# Patient Record
Sex: Male | Born: 1985 | Race: Black or African American | Hispanic: No | Marital: Single | Smoking: Never smoker
Health system: Southern US, Community
[De-identification: ages and names within clinical notes are randomized; demographics above are authoritative.]

---

## 2010-08-24 ENCOUNTER — Emergency Department (HOSPITAL_COMMUNITY)
Admission: EM | Admit: 2010-08-24 | Discharge: 2010-08-25 | Disposition: A | Discharge status: Home / Self Care | Payer: Self-pay | Attending: Emergency Medicine | Admitting: Emergency Medicine

## 2010-08-24 DIAGNOSIS — R109 Unspecified abdominal pain: Secondary | ICD-10-CM | POA: Insufficient documentation

## 2010-08-24 DIAGNOSIS — G8929 Other chronic pain: Secondary | ICD-10-CM | POA: Insufficient documentation

## 2010-08-24 LAB — URINALYSIS, ROUTINE W REFLEX MICROSCOPIC
Nitrite: NEGATIVE
Specific Gravity, Urine: 1.028 (ref 1.005–1.030)
Urobilinogen, UA: 1 mg/dL (ref 0.0–1.0)
pH: 6 (ref 5.0–8.0)

## 2010-08-25 LAB — COMPREHENSIVE METABOLIC PANEL
ALT: 15 U/L (ref 0–53)
AST: 18 U/L (ref 0–37)
Alkaline Phosphatase: 61 U/L (ref 39–117)
CO2: 25 mEq/L (ref 19–32)
Calcium: 9.2 mg/dL (ref 8.4–10.5)
GFR calc Af Amer: >60 mL/min (ref >60)
GFR calc non Af Amer: >60 mL/min (ref >60)
Potassium: 4 mEq/L (ref 3.5–5.1)
Sodium: 138 mEq/L (ref 135–145)
Total Protein: 7.6 g/dL (ref 6.0–8.3)

## 2010-08-25 LAB — CBC
Hemoglobin: 12.6 g/dL — ABNORMAL LOW (ref 13.0–17.0)
MCHC: 33.2 g/dL (ref 30.0–36.0)
WBC: 8.5 10*3/uL (ref 4.0–10.5)

## 2010-08-25 LAB — DIFFERENTIAL
Basophils Absolute: 0 10*3/uL (ref 0.0–0.1)
Basophils Relative: 0 % (ref 0–1)
Lymphocytes Relative: 46 % (ref 12–46)
Monocytes Absolute: 0.6 10*3/uL (ref 0.1–1.0)
Neutro Abs: 3.8 10*3/uL (ref 1.7–7.7)
Neutrophils Relative %: 44 % (ref 43–77)

## 2017-01-05 ENCOUNTER — Encounter (HOSPITAL_BASED_OUTPATIENT_CLINIC_OR_DEPARTMENT_OTHER): Payer: Self-pay

## 2017-01-05 ENCOUNTER — Emergency Department (HOSPITAL_BASED_OUTPATIENT_CLINIC_OR_DEPARTMENT_OTHER)
Admission: EM | Admit: 2017-01-05 | Discharge: 2017-01-06 | Disposition: A | Discharge status: Home / Self Care | Payer: No Typology Code available for payment source | Attending: Emergency Medicine | Admitting: Emergency Medicine

## 2017-01-05 DIAGNOSIS — M25511 Pain in right shoulder: Secondary | ICD-10-CM | POA: Insufficient documentation

## 2017-01-05 DIAGNOSIS — M549 Dorsalgia, unspecified: Secondary | ICD-10-CM | POA: Insufficient documentation
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
## 2017-01-06 ENCOUNTER — Encounter (HOSPITAL_BASED_OUTPATIENT_CLINIC_OR_DEPARTMENT_OTHER): Payer: Self-pay | Admitting: Emergency Medicine

## 2017-01-06 MED ORDER — METHOCARBAMOL 500 MG PO TABS
1000.0000 mg | ORAL_TABLET | Freq: Once | ORAL | Status: AC
Start: 2017-01-06 — End: 2017-01-06
  Administered 2017-01-06: 1000 mg via ORAL
  Filled 2017-01-06: qty 2

## 2017-01-06 MED ORDER — METHOCARBAMOL 500 MG PO TABS: 500.0000 mg | ORAL_TABLET | Freq: Two times a day (BID) | ORAL | 0 refills | Status: AC

## 2017-01-06 MED ORDER — NAPROXEN 375 MG PO TABS: 375.0000 mg | ORAL_TABLET | Freq: Two times a day (BID) | ORAL | 0 refills | Status: AC

## 2017-01-06 MED ORDER — NAPROXEN 250 MG PO TABS
500.0000 mg | ORAL_TABLET | Freq: Once | ORAL | Status: AC
  Administered 2017-01-06: 500 mg via ORAL
  Filled 2017-01-06: qty 2
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
## 2017-03-25 ENCOUNTER — Other Ambulatory Visit (HOSPITAL_COMMUNITY): Payer: Self-pay | Admitting: Research Study

## 2017-03-25 DIAGNOSIS — Z006 Encounter for examination for normal comparison and control in clinical research program: Secondary | ICD-10-CM

## 2017-03-28 ENCOUNTER — Ambulatory Visit (HOSPITAL_COMMUNITY)
Admission: RE | Admit: 2017-03-28 | Discharge: 2017-03-28 | Disposition: A | Discharge status: Home / Self Care | Payer: Self-pay | Source: Ambulatory Visit | Attending: Research Study | Admitting: Research Study

## 2017-03-28 DIAGNOSIS — Z006 Encounter for examination for normal comparison and control in clinical research program: Secondary | ICD-10-CM | POA: Insufficient documentation

## 2019-03-17 IMAGING — CT CT ABD-PELV W/O CM
3 series · 17 of 46 positions shown, 20 images · non-contrast
Comparison: none

[Series 3: postvldax · axial · 0.74mm/px · z∈[+694,+1114]mm · 13 of 95 slices shown, 16 images]
[im 7/95  soft-tissue]
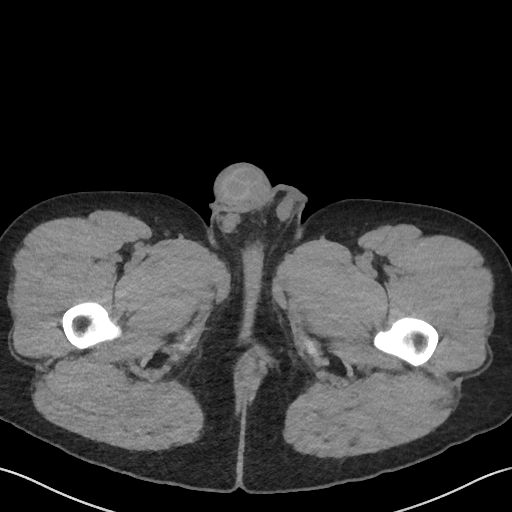
[im 7/95  bone]
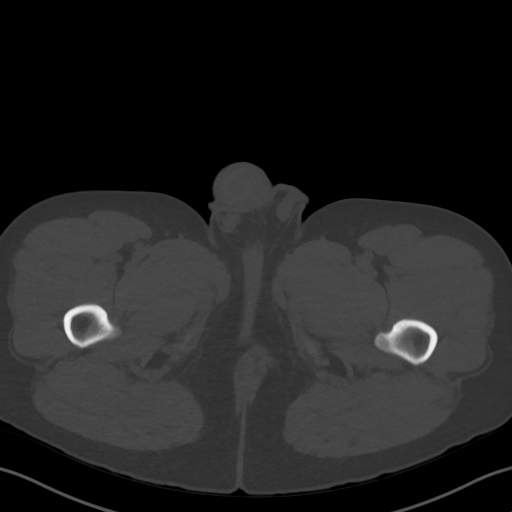
[im 16/95  soft-tissue]
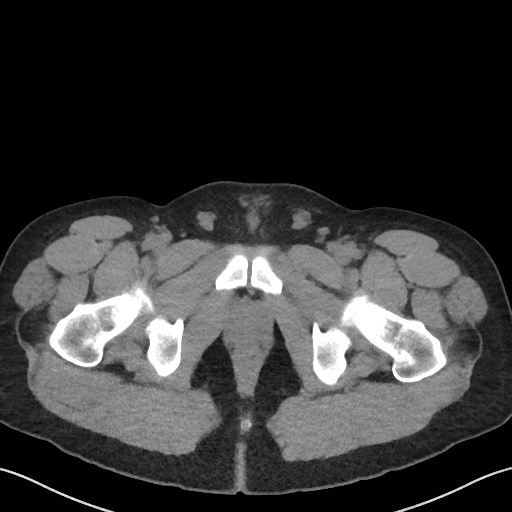
[im 25/95  soft-tissue]
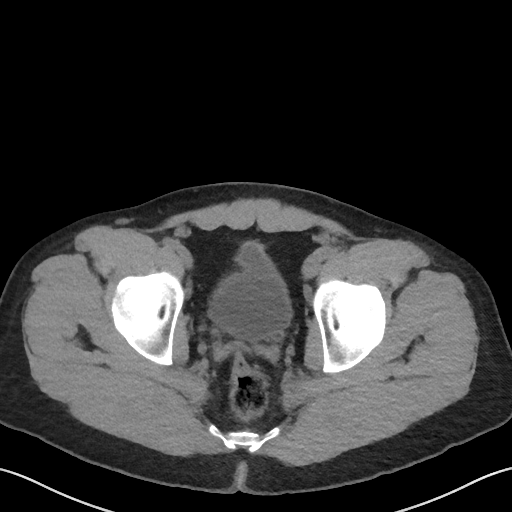
[im 34/95  soft-tissue]
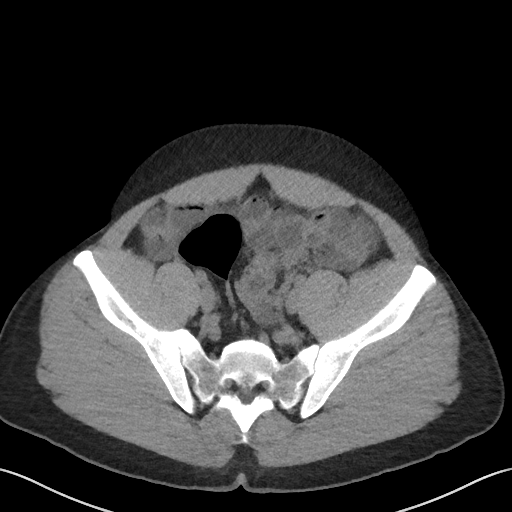
[im 43/95  soft-tissue]
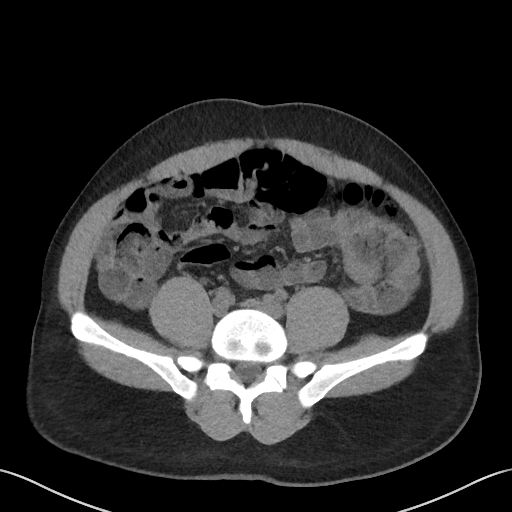
[im 52/95  soft-tissue]
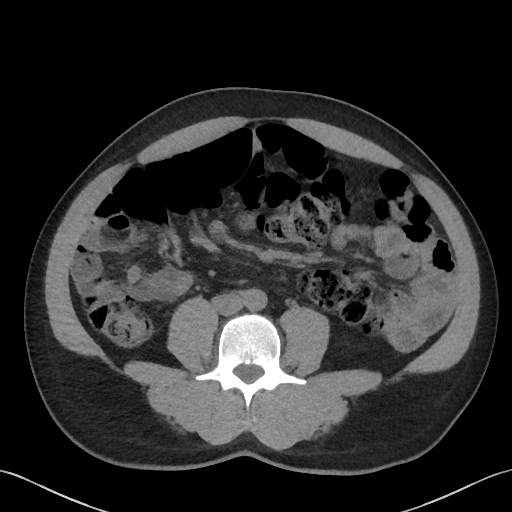
[im 61/95  soft-tissue]
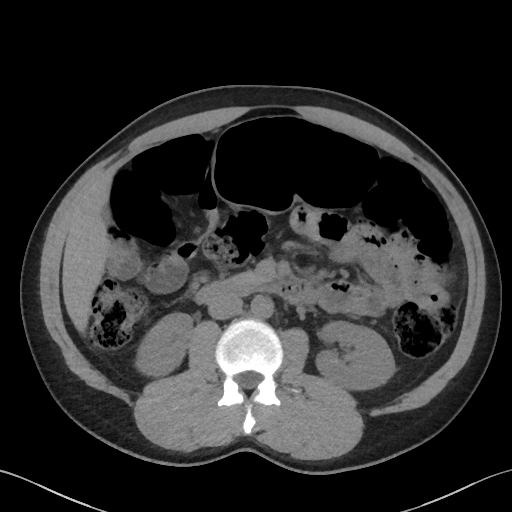
[im 70/95  soft-tissue]
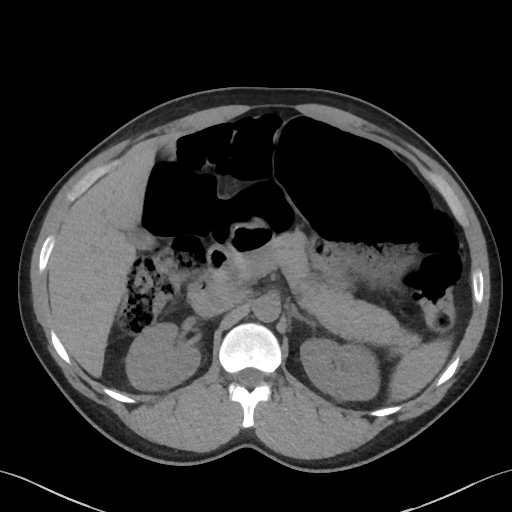
[im 79/95  soft-tissue]
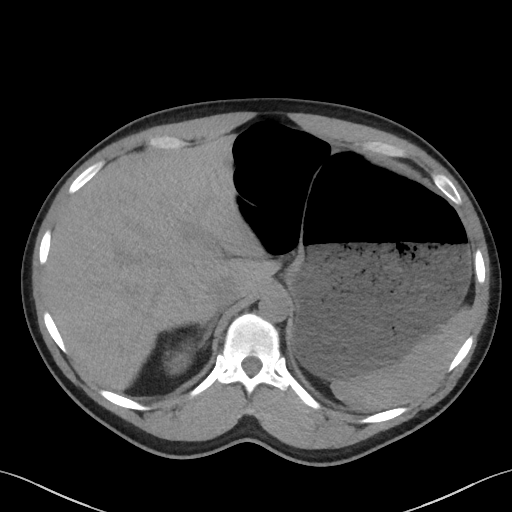
[im 79/95  bone]
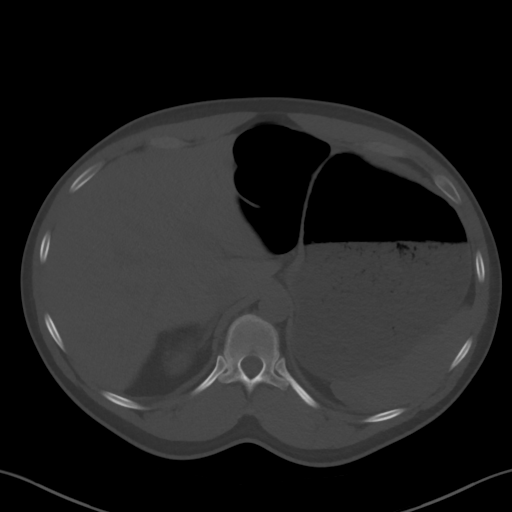
[im 82/95  lung]
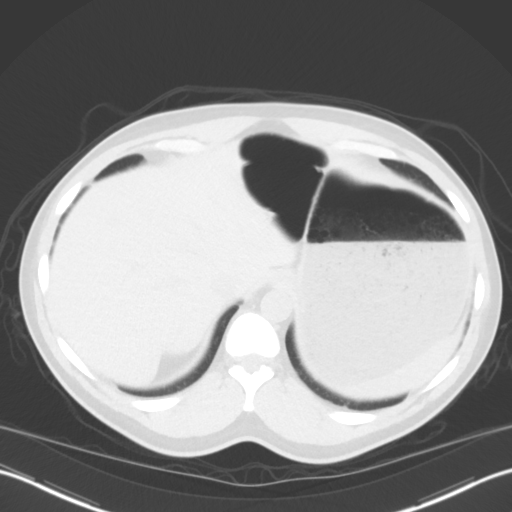
[im 85/95  lung]
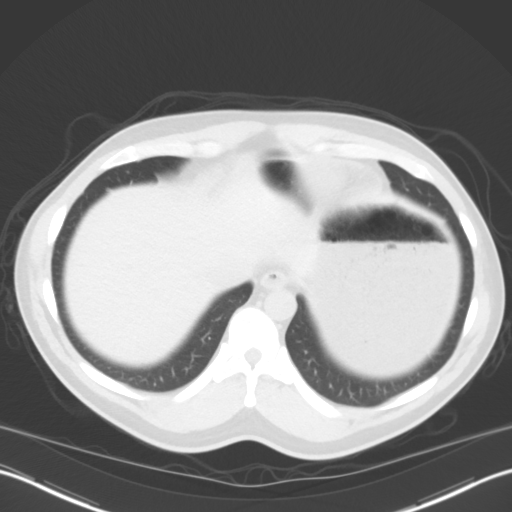
[im 88/95  soft-tissue]
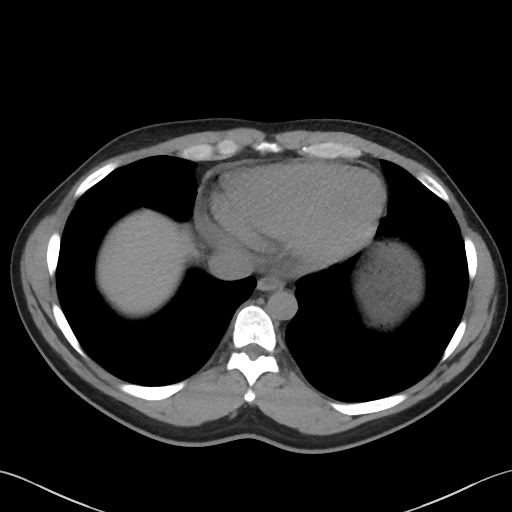
[im 88/95  lung]
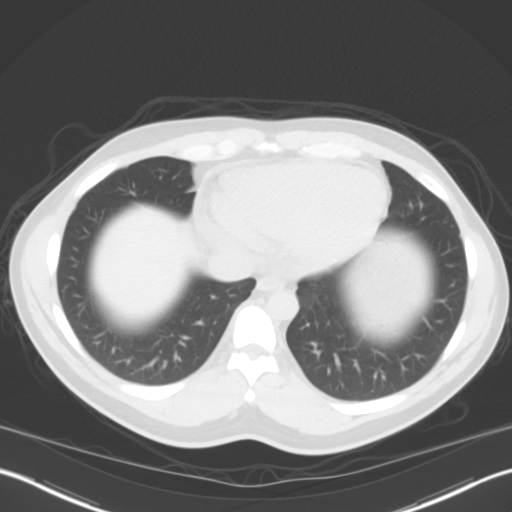
[im 91/95  lung]
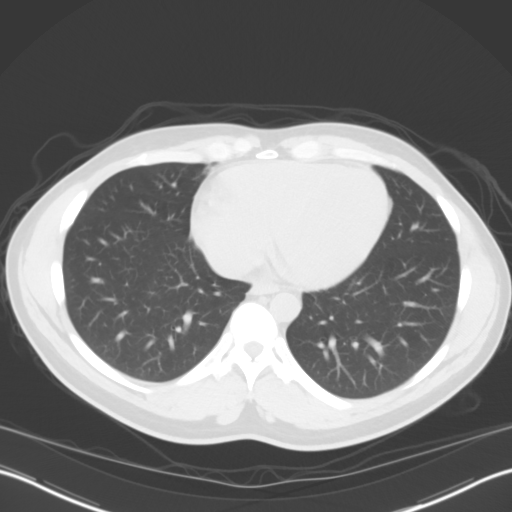

[Series 4: postvldcor · coronal · 0.81mm/px · 3 of 107 slices shown]
[im 36/107  soft-tissue]
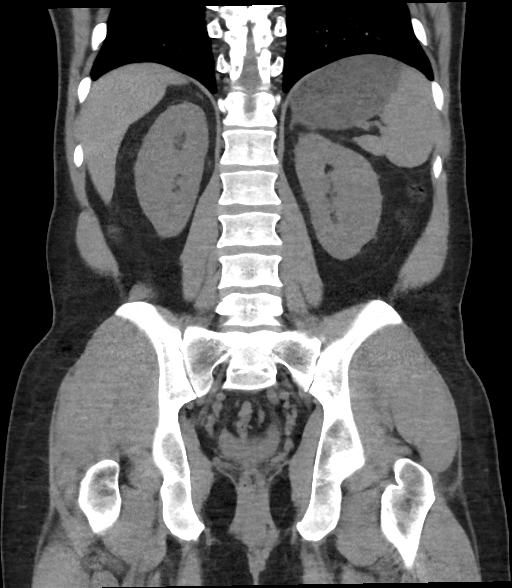
[im 48/107  soft-tissue]
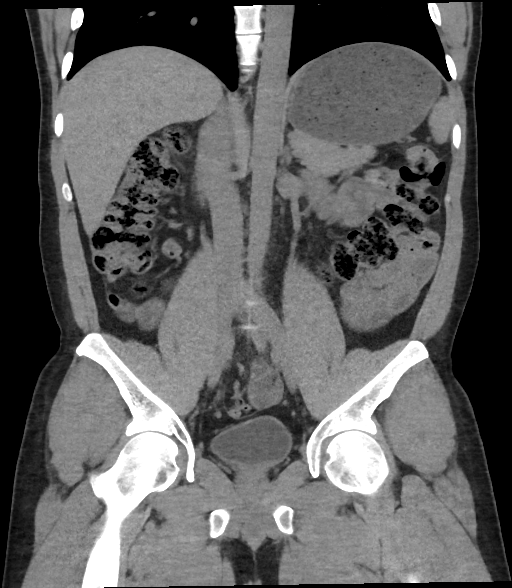
[im 59/107  soft-tissue]
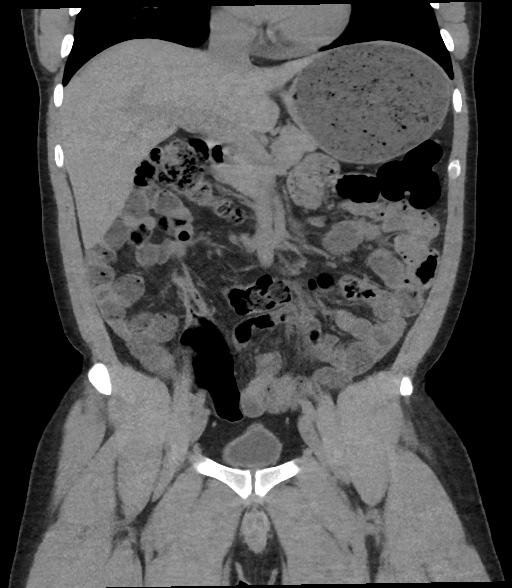

[Series 5: postvldsag · sagittal · 0.78mm/px · 1 of 139 slices shown]
[im 47/139  soft-tissue]
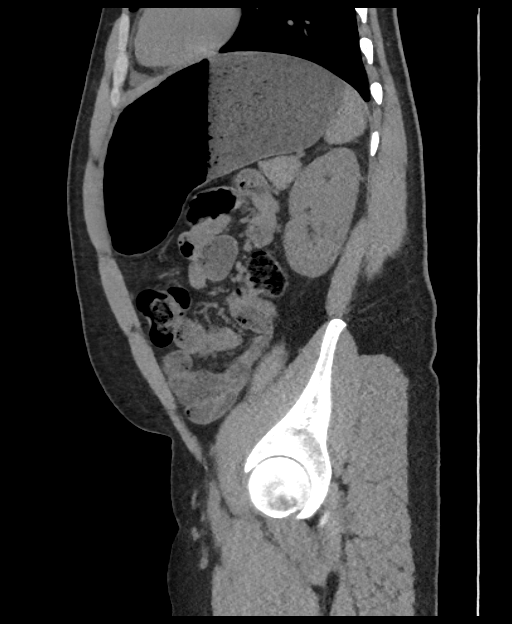

[17 of 46 positions shown; findings below may reference images not displayed]

Canned report from images found in remote index.

Refer to host system for actual result text.
